# Patient Record
Sex: Male | Born: 1990 | Race: White | Marital: Married | Smoking: Former smoker
Health system: Southern US, Community
[De-identification: ages and names within clinical notes are randomized; demographics above are authoritative.]

## PROBLEM LIST (undated history)

## (undated) DIAGNOSIS — K589 Irritable bowel syndrome without diarrhea: Secondary | ICD-10-CM

## (undated) DIAGNOSIS — G629 Polyneuropathy, unspecified: Secondary | ICD-10-CM

## (undated) DIAGNOSIS — C801 Malignant (primary) neoplasm, unspecified: Secondary | ICD-10-CM

---

## 2016-04-09 ENCOUNTER — Emergency Department
Admission: EM | Admit: 2016-04-09 | Discharge: 2016-04-09 | Disposition: A | Discharge status: Home / Self Care | Payer: Self-pay | Attending: Emergency Medicine | Admitting: Emergency Medicine

## 2016-04-09 ENCOUNTER — Encounter: Payer: Self-pay | Admitting: Emergency Medicine

## 2016-04-09 ENCOUNTER — Emergency Department: Payer: Self-pay

## 2016-04-09 DIAGNOSIS — N132 Hydronephrosis with renal and ureteral calculous obstruction: Secondary | ICD-10-CM

## 2016-04-09 DIAGNOSIS — Z87891 Personal history of nicotine dependence: Secondary | ICD-10-CM | POA: Insufficient documentation

## 2016-04-09 DIAGNOSIS — R590 Localized enlarged lymph nodes: Secondary | ICD-10-CM

## 2016-04-09 DIAGNOSIS — K589 Irritable bowel syndrome without diarrhea: Secondary | ICD-10-CM

## 2016-04-09 DIAGNOSIS — R079 Chest pain, unspecified: Secondary | ICD-10-CM | POA: Insufficient documentation

## 2016-04-09 DIAGNOSIS — R5381 Other malaise: Secondary | ICD-10-CM

## 2016-04-09 DIAGNOSIS — Z85830 Personal history of malignant neoplasm of bone: Secondary | ICD-10-CM | POA: Insufficient documentation

## 2016-04-09 DIAGNOSIS — J029 Acute pharyngitis, unspecified: Secondary | ICD-10-CM

## 2016-04-09 DIAGNOSIS — R5383 Other fatigue: Secondary | ICD-10-CM | POA: Insufficient documentation

## 2016-04-09 HISTORY — DX: Irritable bowel syndrome, unspecified: K58.9

## 2016-04-09 HISTORY — DX: Malignant (primary) neoplasm, unspecified: C80.1

## 2016-04-09 HISTORY — DX: Polyneuropathy, unspecified: G62.9

## 2016-04-09 LAB — COMPREHENSIVE METABOLIC PANEL
ALBUMIN: 4.9 g/dL (ref 3.5–5.0)
ALK PHOS: 48 U/L (ref 38–126)
ALT: 17 U/L (ref 17–63)
ANION GAP: 7 (ref 5–15)
AST: 18 U/L (ref 15–41)
BUN: 9 mg/dL (ref 6–20)
CALCIUM: 9.7 mg/dL (ref 8.9–10.3)
CHLORIDE: 100 mmol/L — AB (ref 101–111)
CO2: 32 mmol/L (ref 22–32)
Creatinine, Ser: 0.92 mg/dL (ref 0.61–1.24)
GFR calc non Af Amer: >60 mL/min (ref >60)
GLUCOSE: 91 mg/dL (ref 65–99)
Potassium: 4 mmol/L (ref 3.5–5.1)
SODIUM: 139 mmol/L (ref 135–145)
Total Bilirubin: 0.7 mg/dL (ref 0.3–1.2)
Total Protein: 8 g/dL (ref 6.5–8.1)

## 2016-04-09 LAB — CBC WITH DIFFERENTIAL/PLATELET
BASOS ABS: 0.2 10*3/uL — AB (ref 0–0.1)
Basophils Relative: 3 %
Eosinophils Absolute: 0.4 10*3/uL (ref 0–0.7)
Eosinophils Relative: 6 %
HEMATOCRIT: 39.7 % — AB (ref 40.0–52.0)
HEMOGLOBIN: 13.7 g/dL (ref 13.0–18.0)
LYMPHS ABS: 1.7 10*3/uL (ref 1.0–3.6)
LYMPHS PCT: 27 %
MCH: 30.3 pg (ref 26.0–34.0)
MCHC: 34.5 g/dL (ref 32.0–36.0)
MCV: 87.7 fL (ref 80.0–100.0)
Monocytes Absolute: 0.5 10*3/uL (ref 0.2–1.0)
Monocytes Relative: 8 %
NEUTROS ABS: 3.6 10*3/uL (ref 1.4–6.5)
Neutrophils Relative %: 56 %
Platelets: 204 10*3/uL (ref 150–440)
RBC: 4.53 MIL/uL (ref 4.40–5.90)
RDW: 13.7 % (ref 11.5–14.5)
WBC: 6.3 10*3/uL (ref 3.8–10.6)

## 2016-04-09 LAB — POCT RAPID STREP A: STREPTOCOCCUS, GROUP A SCREEN (DIRECT): NEGATIVE

## 2016-04-09 LAB — URINALYSIS COMPLETE WITH MICROSCOPIC (ARMC ONLY)
BACTERIA UA: NONE SEEN
Bilirubin Urine: NEGATIVE
Glucose, UA: NEGATIVE mg/dL
Hgb urine dipstick: NEGATIVE
KETONES UR: NEGATIVE mg/dL
Nitrite: NEGATIVE
PH: 7 (ref 5.0–8.0)
PROTEIN: NEGATIVE mg/dL
SQUAMOUS EPITHELIAL / LPF: NONE SEEN
Specific Gravity, Urine: 1.006 (ref 1.005–1.030)

## 2016-04-09 LAB — MONONUCLEOSIS SCREEN: MONO SCREEN: NEGATIVE

## 2016-04-09 LAB — TSH: TSH: 3.373 u[IU]/mL (ref 0.350–4.500)

## 2016-04-09 LAB — TROPONIN I

## 2016-04-09 LAB — LIPASE, BLOOD: Lipase: 28 U/L (ref 11–51)

## 2016-04-09 MED ORDER — DIATRIZOATE MEGLUMINE & SODIUM 66-10 % PO SOLN
15.0000 mL | ORAL | Status: AC
  Administered 2016-04-09: 30 mL via ORAL

## 2016-04-09 MED ORDER — SODIUM CHLORIDE 0.9 % IV BOLUS (SEPSIS)
1000.0000 mL | Freq: Once | INTRAVENOUS | Status: AC
  Administered 2016-04-09: 1000 mL via INTRAVENOUS

## 2016-04-09 MED ORDER — IOPAMIDOL (ISOVUE-300) INJECTION 61%
100.0000 mL | Freq: Once | INTRAVENOUS | Status: AC | PRN
  Administered 2016-04-09: 100 mL via INTRAVENOUS
  Filled 2016-04-09: qty 100

## 2016-04-09 MED ORDER — OXYCODONE-ACETAMINOPHEN 5-325 MG PO TABS: 1.0000 | ORAL_TABLET | Freq: Four times a day (QID) | ORAL | Status: AC | PRN

## 2016-04-09 MED ORDER — TAMSULOSIN HCL 0.4 MG PO CAPS: 0.4000 mg | ORAL_CAPSULE | Freq: Every day | ORAL | Status: AC

## 2016-04-09 NOTE — Discharge Instructions (Signed)
Kidney Stones °Kidney stones (urolithiasis) are deposits that form inside your kidneys. The intense pain is caused by the stone moving through the urinary tract. When the stone moves, the ureter goes into spasm around the stone. The stone is usually passed in the urine.  °CAUSES  °· A disorder that makes certain neck glands produce too much parathyroid hormone (primary hyperparathyroidism). °· A buildup of uric acid crystals, similar to gout in your joints. °· Narrowing (stricture) of the ureter. °· A kidney obstruction present at birth (congenital obstruction). °· Previous surgery on the kidney or ureters. °· Numerous kidney infections. °SYMPTOMS  °· Feeling sick to your stomach (nauseous). °· Throwing up (vomiting). °· Blood in the urine (hematuria). °· Pain that usually spreads (radiates) to the groin. °· Frequency or urgency of urination. °DIAGNOSIS  °· Taking a history and physical exam. °· Blood or urine tests. °· CT scan. °· Occasionally, an examination of the inside of the urinary bladder (cystoscopy) is performed. °TREATMENT  °· Observation. °· Increasing your fluid intake. °· Extracorporeal shock wave lithotripsy--This is a noninvasive procedure that uses shock waves to break up kidney stones. °· Surgery may be needed if you have severe pain or persistent obstruction. There are various surgical procedures. Most of the procedures are performed with the use of small instruments. Only small incisions are needed to accommodate these instruments, so recovery time is minimized. °The size, location, and chemical composition are all important variables that will determine the proper choice of action for you. Talk to your health care provider to better understand your situation so that you will minimize the risk of injury to yourself and your kidney.  °HOME CARE INSTRUCTIONS  °· Drink enough water and fluids to keep your urine clear or pale yellow. This will help you to pass the stone or stone fragments. °· Strain  all urine through the provided strainer. Keep all particulate matter and stones for your health care provider to see. The stone causing the pain may be as small as a grain of salt. It is very important to use the strainer each and every time you pass your urine. The collection of your stone will allow your health care provider to analyze it and verify that a stone has actually passed. The stone analysis will often identify what you can do to reduce the incidence of recurrences. °· Only take over-the-counter or prescription medicines for pain, discomfort, or fever as directed by your health care provider. °· Keep all follow-up visits as told by your health care provider. This is important. °· Get follow-up X-rays if required. The absence of pain does not always mean that the stone has passed. It may have only stopped moving. If the urine remains completely obstructed, it can cause loss of kidney function or even complete destruction of the kidney. It is your responsibility to make sure X-rays and follow-ups are completed. Ultrasounds of the kidney can show blockages and the status of the kidney. Ultrasounds are not associated with any radiation and can be performed easily in a matter of minutes. °· Make changes to your daily diet as told by your health care provider. You may be told to: °¨ Limit the amount of salt that you eat. °¨ Eat 5 or more servings of fruits and vegetables each day. °¨ Limit the amount of meat, poultry, fish, and eggs that you eat. °· Collect a 24-hour urine sample as told by your health care provider. You may need to collect another urine sample every 6-12   months. °SEEK MEDICAL CARE IF: °· You experience pain that is progressive and unresponsive to any pain medicine you have been prescribed. °SEEK IMMEDIATE MEDICAL CARE IF:  °· Pain cannot be controlled with the prescribed medicine. °· You have a fever or shaking chills. °· The severity or intensity of pain increases over 18 hours and is not  relieved by pain medicine. °· You develop a new onset of abdominal pain. °· You feel faint or pass out. °· You are unable to urinate. °  °This information is not intended to replace advice given to you by your health care provider. Make sure you discuss any questions you have with your health care provider. °  °Document Released: 09/28/2005 Document Revised: 06/19/2015 Document Reviewed: 03/01/2013 °Elsevier Interactive Patient Education ©2016 [employer] ° °

## 2016-04-09 NOTE — ED Notes (Signed)
C/O "lump just above adams apple and to jaw line"  X 2-3 months.  Symptoms have been evaluated at [facility] and "they don't have an answer for what is going on in my throat.

## 2016-04-09 NOTE — ED Provider Notes (Signed)
[facility] Emergency Department Provider Note  ____________________________________________  Time seen: Approximately 6:08 PM  I have reviewed the triage vital signs and the nursing notes.   HISTORY  Chief Complaint lumps to throat and neck     HPI Arthur Larson is a 25 y.o. male who presents to the emergency department complaining of multiple medical complaints. Patient states that he has been seeing [facility] for multiple complaints without diagnosis. Patient states that symptoms began 2 months prior with a sore throat. He was tested for strep which she states was negative. States that the pain has continued since this time. Patient also endorses "lumps" to the right side of his neck and above his Adam's apple. Patient states that he has experienced chest pain, abdominal pain, a 50 pound weight loss, hematochezia for the last 2 months. Patient states that he has a history of bone cancer that was treated with radiation in 2013. In 2014 he was diagnosed with IBS but states that over the past 2 months his GI bleeding has improved from baseline. Patient still complains of worsening left upper quadrant abdominal pain. Patient states that over the past several years he has had colonoscopies and endoscopies. These have been unremarkable with the exception of the occasional polyp. Patient reports just feeling "horrible all over." He denies any fevers, headache, neck pain, visual changes, diarrhea this time, dysuria, polyuria, hematuria. He does endorse sore throat, chest pain, abdominal pain at this time.Patient also reports malaise, fatigue.   Past Medical History  Diagnosis Date  . IBS (irritable bowel syndrome)   . Neuropathy ([place])   . 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   History reviewed. No pertinent past surgical history.  Current Outpatient Rx  Name  Route  Sig  Dispense  Refill  .  oxyCODONE-acetaminophen (ROXICET) 5-325 MG tablet   Oral   Take 1 tablet by mouth every 6 (six) hours as needed for severe pain.   20 tablet   0   . tamsulosin (FLOMAX) 0.4 MG CAPS capsule   Oral   Take 1 capsule (0.4 mg total) by mouth daily.   30 capsule   0     Allergies Review of patient's allergies indicates no known allergies.  No family history on file.  Social History Social History  Substance Use Topics  . Smoking status: Former Research scientist (life sciences)  . Smokeless tobacco: Never Used  . Alcohol Use: No     Review of Systems  Constitutional: No fever/chills Eyes: No visual changes. No discharge ENT: Positive for sore throat. Denies nasal congestion or ear pain. Positive for "lump" to the right side of the neck. Positive for lump above the "Adam's apple." Cardiovascular: Positive chest pain. Respiratory: no cough. No SOB. Gastrointestinal: Positive left upper quadrant abdominal pain. Positive for GI bleeding.  No nausea, no vomiting.  No diarrhea.  No constipation. Genitourinary: Negative for dysuria. No hematuria Musculoskeletal: Negative for musculoskeletal pain. Skin: Negative for rash, abrasions, lacerations, ecchymosis.  Neurological: Negative for headaches, focal weakness or numbness. 10-point ROS otherwise negative.  ____________________________________________   PHYSICAL EXAM:  VITAL SIGNS: ED Triage Vitals  Enc Vitals Group     BP 04/09/16 1559 127/76 mmHg     Pulse Rate 04/09/16 1559 81     Resp 04/09/16 1559 16     Temp 04/09/16 1559 98 F (36.7 C)     Temp Source  04/09/16 1559 Oral     SpO2 04/09/16 1559 100 %     Weight 04/09/16 1559 140 lb (63.504 kg)     Height 04/09/16 1559 5\' 11"  (1.803 m)     Head Cir --      Peak Flow --      Pain Score 04/09/16 1601 4     Pain Loc --      Pain Edu? --      Excl. in [place]? --      Constitutional: Alert and oriented. Well appearing and in no acute distress. Eyes: Conjunctivae are normal. PERRL. EOMI. Head:  Atraumatic. ENT:      Ears: EACs are unremarkable bilaterally. TMs are unremarkable bilaterally.      Nose: No congestion/rhinnorhea.      Mouth/Throat: Mucous membranes are moist. Oropharynx is mildly erythematous but nonedematous. Uvula is midline. Tonsils are unremarkable. Neck: No stridor. Neck is supple full range of motion Hematological/Lymphatic/Immunilogical: Diffuse anterior and posterior cervical lymphadenopathy on right side. Anterior cervical lymphadenopathy on the the left but no posterior chain involvement. Cardiovascular: Normal rate, regular rhythm. Normal S1 and S2.  Good peripheral circulation. Respiratory: Normal respiratory effort without tachypnea or retractions. Lungs CTAB. Good air entry to the bases with no decreased or absent breath sounds. Gastrointestinal: Bowel sounds 4 quadrants. Soft to palpation. Patient is tender to palpation left upper quadrant.. No guarding or rigidity. No palpable masses. No palpable hepato-or splenomegaly. No distention. No CVA tenderness. Musculoskeletal: Full range of motion to all extremities. No gross deformities appreciated. Neurologic:  Normal speech and language. No gross focal neurologic deficits are appreciated.  Skin:  Skin is warm, dry and intact. No rash noted. Psychiatric: Mood and affect are normal. Speech and behavior are normal. Patient exhibits appropriate insight and judgement.   ____________________________________________   LABS (all labs ordered are listed, but only abnormal results are displayed)  Labs Reviewed  COMPREHENSIVE METABOLIC PANEL - Abnormal; Notable for the following:    Chloride 100 (*)    All other components within normal limits  CBC WITH DIFFERENTIAL/PLATELET - Abnormal; Notable for the following:    HCT 39.7 (*)    Basophils Absolute 0.2 (*)    All other components within normal limits  URINALYSIS COMPLETEWITH MICROSCOPIC ([facility] ONLY) - Abnormal; Notable for the following:    Color, Urine STRAW  (*)    APPearance CLEAR (*)    Leukocytes, UA TRACE (*)    All other components within normal limits  LIPASE, BLOOD  TROPONIN I  MONONUCLEOSIS SCREEN  TSH  T4  POCT RAPID STREP A   ____________________________________________  EKG  EKG reveals normal sinus rhythm at a rate of 63 bpm. No ST elevations or depressions noted. PR, QRS, QT intervals within normal limits. No Q waves or delta waves identified. ____________________________________________  RADIOLOGY Diamantina [facility] Bastian Andreoli, personally viewed and evaluated these images as part of my medical decision making, as well as reviewing the written report by the radiologist.  Ct Soft Tissue Neck W Contrast  04/09/2016  CLINICAL DATA:  Evaluate lumps in the neck and throat. These are ongoing for several weeks. EXAM: CT NECK WITH CONTRAST TECHNIQUE: Multidetector CT imaging of the neck was performed using the standard protocol following the bolus administration of intravenous contrast. CONTRAST:  188mL ISOVUE-300 IOPAMIDOL (ISOVUE-300) INJECTION 61% COMPARISON:  CT chest abdomen and pelvis reported separately. FINDINGS: Pharynx and larynx: Normal Salivary glands: Normal Thyroid: Normal Lymph nodes: Nonspecific BILATERAL level I and II lymphadenopathy, slightly larger  on the LEFT, 8-9 mm in short axis. These are nonspecific, most commonly reactive in nature. Vascular: Patent Limited intracranial: Negative. Visualized orbits: Negative. Mastoids and visualized paranasal sinuses: No fluid or masses. Skeleton: No osseous findings. Upper chest: Reported separately. IMPRESSION: Nonspecific cervical adenopathy, most commonly reactive in nature. Within limits for detection on neck CT, no mucosal lesion, or tonsillar/adenoidal inflammatory change. Electronically Signed   By: Staci Righter M.D.   On: 04/09/2016 21:06   Ct Chest W Contrast  04/09/2016  CLINICAL DATA:  History of inflammatory bowel disease and unintentional significant weight loss. Per  patient's report history of bone marrow cancer. Neck masses. EXAM: CT CHEST WITH CONTRAST CT ABDOMEN AND PELVIS WITH CONTRAST TECHNIQUE: Multidetector CT imaging of the chest was performed during intravenous contrast administration. Multidetector CT imaging of the abdomen and pelvis was performed following the standard protocol during bolus administration of intravenous contrast. CONTRAST:  151mL ISOVUE-300 IOPAMIDOL (ISOVUE-300) INJECTION 61% COMPARISON:  None. FINDINGS: CT CHEST FINDINGS Mediastinum/Lymph Nodes: No masses, pathologically enlarged lymph nodes, or other significant abnormality. Lungs/Pleura: No pulmonary mass, infiltrate, or effusion. Musculoskeletal: No chest wall mass or suspicious bone lesions identified. CT ABDOMEN PELVIS FINDINGS Hepatobiliary: No masses or other significant abnormality. The gallbladder is collapse, otherwise normal. Pancreas: No mass, inflammatory changes, or other significant abnormality. Spleen: Within normal limits in size and appearance. Adrenals/Urinary Tract: The right kidney has normal appearance. There is a by a 9 by 5 mm obstructing calculus within the proximal left ureter causing upstream moderate hydroureter and hydronephrosis. Stomach/Bowel: No evidence of obstruction. There is a smooth wall thickening of long segment of the ascending colon and cecum. Vascular/Lymphatic: No pathologically enlarged lymph nodes. No evidence of abdominal aortic aneurysm. Reproductive: No mass or other significant abnormality. Other: None. Musculoskeletal:  No suspicious bone lesions identified. IMPRESSION: 9 mm proximal left ureteral calculus causing obstructive uropathy. Long segment of smooth wall thickening of the cecum and ascending colon, with likely inflammatory appearance. No other acute abnormalities within the chest abdomen or pelvis. Electronically Signed   By: Fidela Salisbury M.D.   On: 04/09/2016 20:56   Ct Abdomen Pelvis W Contrast  04/09/2016  CLINICAL DATA:   History of inflammatory bowel disease and unintentional significant weight loss. Per patient's report history of bone marrow cancer. Neck masses. EXAM: CT CHEST WITH CONTRAST CT ABDOMEN AND PELVIS WITH CONTRAST TECHNIQUE: Multidetector CT imaging of the chest was performed during intravenous contrast administration. Multidetector CT imaging of the abdomen and pelvis was performed following the standard protocol during bolus administration of intravenous contrast. CONTRAST:  134mL ISOVUE-300 IOPAMIDOL (ISOVUE-300) INJECTION 61% COMPARISON:  None. FINDINGS: CT CHEST FINDINGS Mediastinum/Lymph Nodes: No masses, pathologically enlarged lymph nodes, or other significant abnormality. Lungs/Pleura: No pulmonary mass, infiltrate, or effusion. Musculoskeletal: No chest wall mass or suspicious bone lesions identified. CT ABDOMEN PELVIS FINDINGS Hepatobiliary: No masses or other significant abnormality. The gallbladder is collapse, otherwise normal. Pancreas: No mass, inflammatory changes, or other significant abnormality. Spleen: Within normal limits in size and appearance. Adrenals/Urinary Tract: The right kidney has normal appearance. There is a by a 9 by 5 mm obstructing calculus within the proximal left ureter causing upstream moderate hydroureter and hydronephrosis. Stomach/Bowel: No evidence of obstruction. There is a smooth wall thickening of long segment of the ascending colon and cecum. Vascular/Lymphatic: No pathologically enlarged lymph nodes. No evidence of abdominal aortic aneurysm. Reproductive: No mass or other significant abnormality. Other: None. Musculoskeletal:  No suspicious bone lesions identified. IMPRESSION: 9 mm proximal left  ureteral calculus causing obstructive uropathy. Long segment of smooth wall thickening of the cecum and ascending colon, with likely inflammatory appearance. No other acute abnormalities within the chest abdomen or pelvis. Electronically Signed   By: Fidela Salisbury M.D.   On:  04/09/2016 20:56    ____________________________________________    PROCEDURES  Procedure(s) performed:       Medications  diatrizoate meglumine-sodium (GASTROGRAFIN) 66-10 % solution 15 mL (30 mLs Oral Given 04/09/16 1833)  sodium chloride 0.9 % bolus 1,000 mL (0 mLs Intravenous Stopped 04/09/16 2309)  iopamidol (ISOVUE-300) 61 % injection 100 mL (100 mLs Intravenous Contrast Given 04/09/16 2021)     ____________________________________________   INITIAL IMPRESSION / ASSESSMENT AND PLAN / ED COURSE  Pertinent labs & imaging results that were available during my care of the patient were reviewed by me and considered in my medical decision making (see chart for details).  Patient's diagnosis is consistent with Hydronephrosis of obstructing calculus in the left ureter. This is an incidental finding on CT scan. Patient is not symptomatic for this complaint. Patient presented to the emergency department complaining of fatigue, lymphadenopathy, sore throat, weight loss. Labs, imaging, and physical exam are reassuring at this time for no acute processes. Patient does have the incidental finding of hydronephrosis from nonobstructing calculus. Patient is to be referred to urology for further management of this condition. Patient will be discharged home with Flomax and Percocet as needed. Patient still has complaint of sore throat, lymphadenopathy, bruits and fatigue, and IBS. However as mentioned above no significant findings are appreciated on exam, labs, and imaging to explain symptoms.. Patient will follow-up with the [facility] for further evaluation of these complaints. Patient is given ED precautions to return to the ED for any worsening or new symptoms.     ____________________________________________  FINAL CLINICAL IMPRESSION(S) / ED DIAGNOSES  Final diagnoses:  Hydronephrosis with obstructing calculus  Sore throat  Lymphadenopathy, cervical  Malaise and fatigue  IBS  (irritable bowel syndrome)      NEW MEDICATIONS STARTED DURING THIS VISIT:  Discharge Medication List as of 04/09/2016 10:09 PM    START taking these medications   Details  oxyCODONE-acetaminophen (ROXICET) 5-325 MG tablet Take 1 tablet by mouth every 6 (six) hours as needed for severe pain., Starting 04/09/2016, Until Discontinued, Print    tamsulosin (FLOMAX) 0.4 MG CAPS capsule Take 1 capsule (0.4 mg total) by mouth daily., Starting 04/09/2016, Until Discontinued, Print            This chart was dictated using voice recognition software/Dragon. Despite best efforts to proofread, errors can occur which can change the meaning. Any change was purely unintentional.    Darletta Moll, PA-C 04/09/16 2353  Harvest Dark, MD 04/10/16 2255

## 2016-04-09 NOTE — ED Notes (Signed)
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

## 2016-04-11 LAB — T4: T4 TOTAL: 7.4 ug/dL (ref 4.5–12.0)

## 2017-03-12 IMAGING — CT CT CHEST W/ CM
2 of 4 series · 13 of 36 positions shown, 16 images · IV contrast (iopamidol)
Comparison: None.

CLINICAL DATA: History of inflammatory bowel disease and
unintentional significant weight loss. Per patient's report history
of bone marrow cancer. Neck masses.

EXAM:
CT CHEST WITH CONTRAST
CT ABDOMEN AND PELVIS WITH CONTRAST
TECHNIQUE: Multidetector CT imaging of the chest was performed during
intravenous contrast administration. Multidetector CT imaging of the
abdomen and pelvis was performed following the standard protocol
during bolus administration of intravenous contrast.
CONTRAST:  100mL 6NMCV0-JII IOPAMIDOL (6NMCV0-JII) INJECTION 61%

[Series 3: cap with · axial · 0.69mm/px · z∈[-810,-240]mm · 10 of 138 slices shown, 13 images]
[im 12/138  mediastinal]
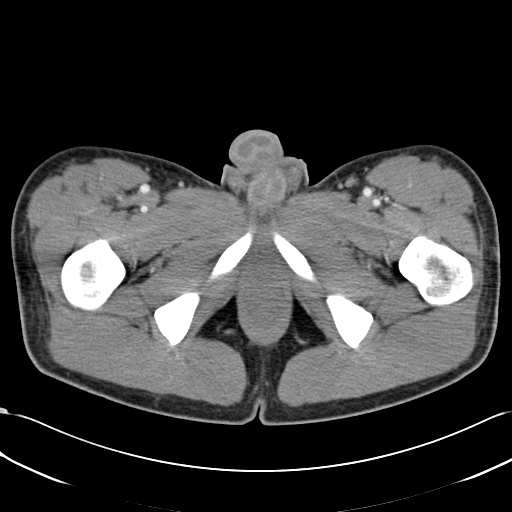
[im 12/138  lung]
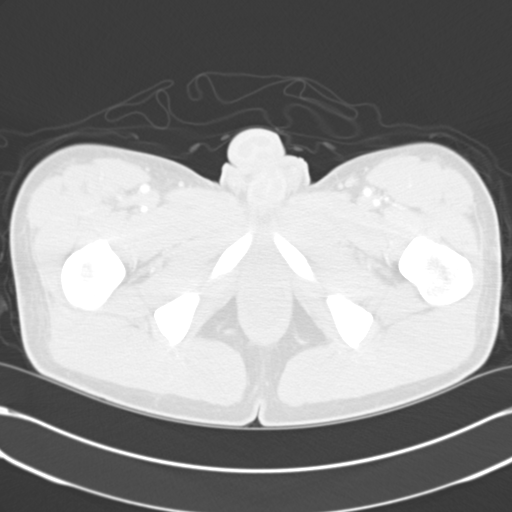
[im 23/138  lung]
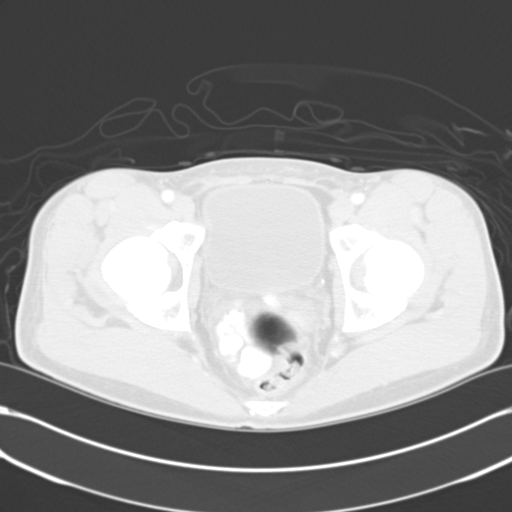
[im 35/138  lung]
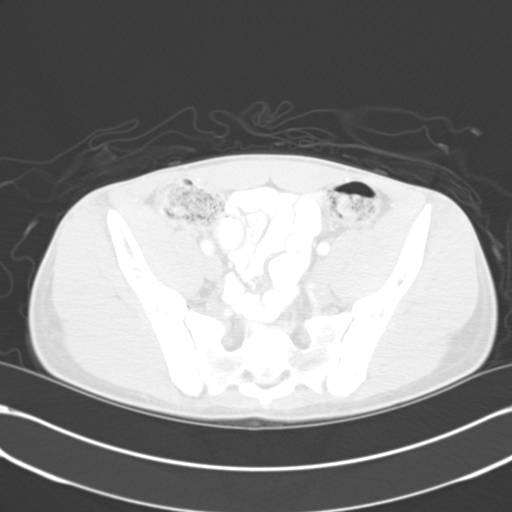
[im 46/138  lung]
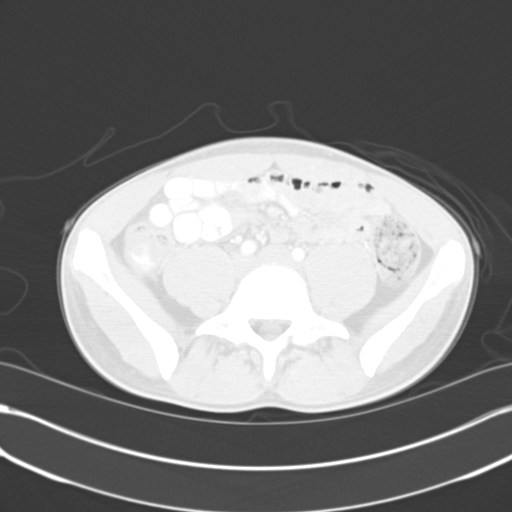
[im 58/138  mediastinal]
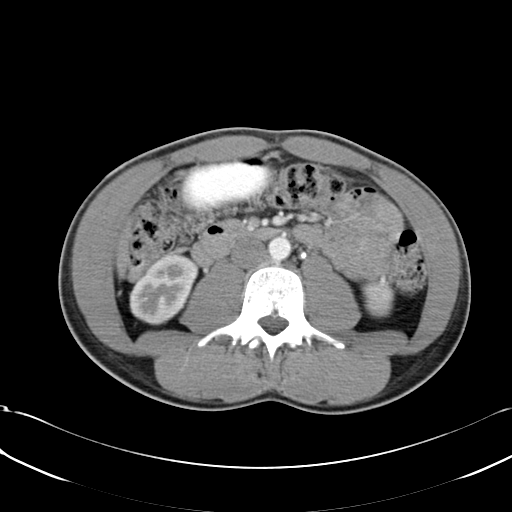
[im 58/138  lung]
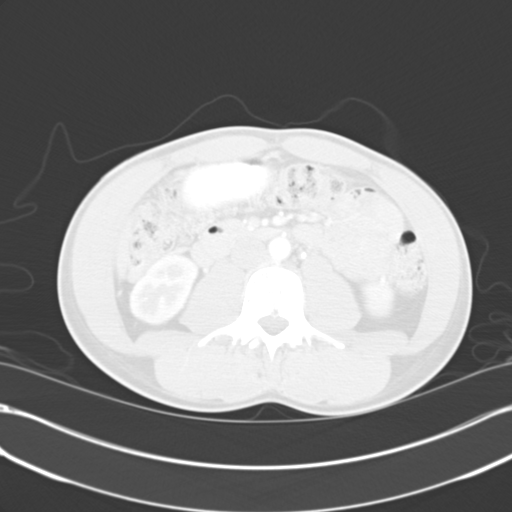
[im 80/138  lung]
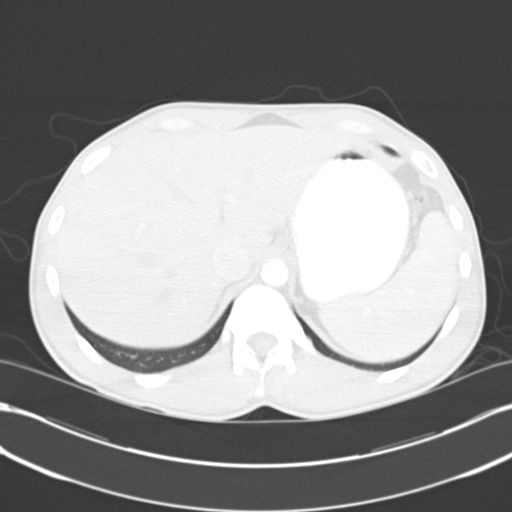
[im 92/138  lung]
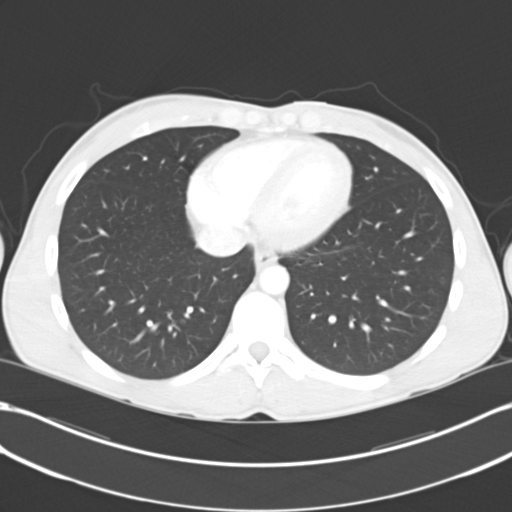
[im 103/138  lung]
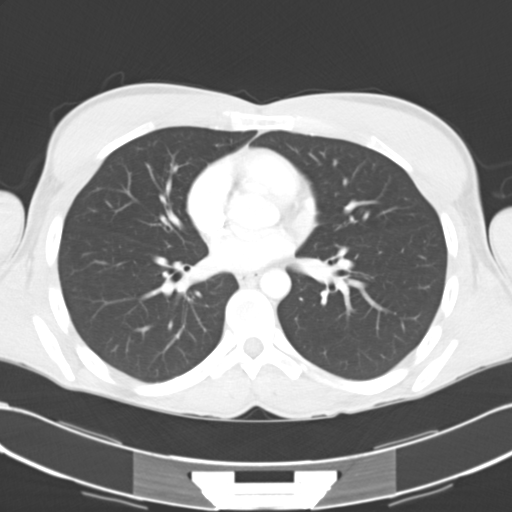
[im 115/138  mediastinal]
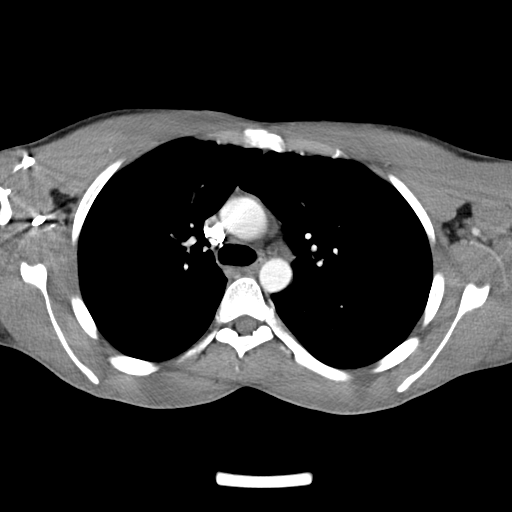
[im 115/138  lung]
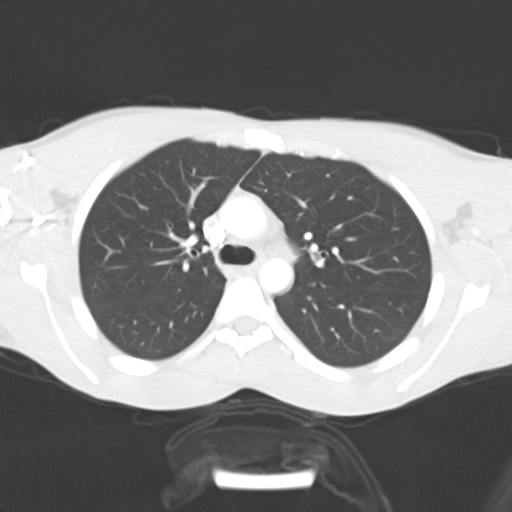
[im 126/138  lung]
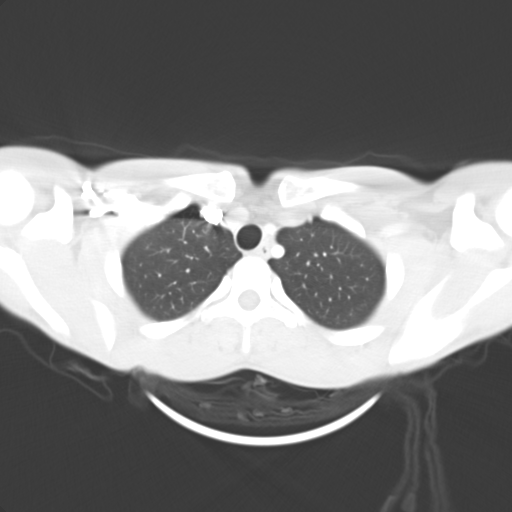

[Series 7: cor cap with · coronal · 0.66mm/px · 3 of 112 slices shown]
[im 23/112  lung]
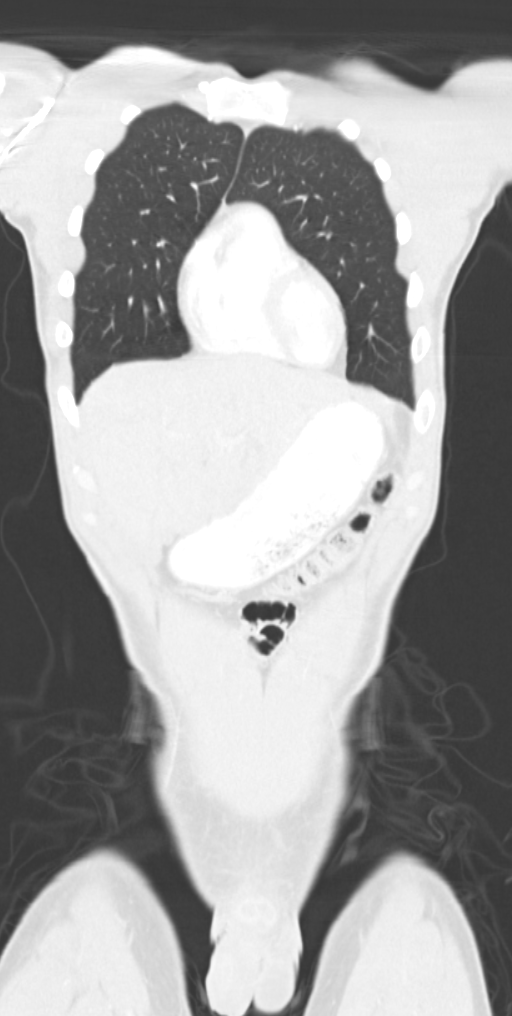
[im 45/112  lung]
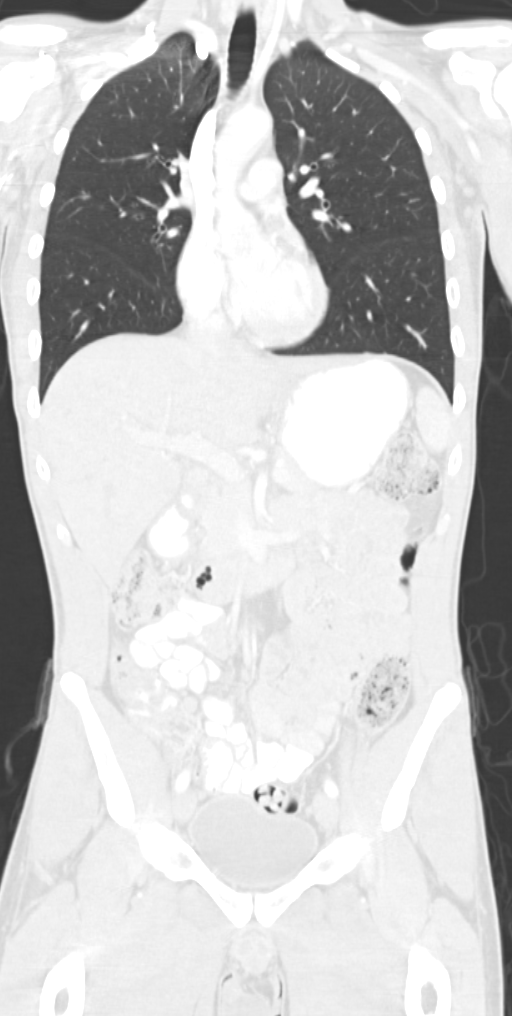
[im 67/112  lung]
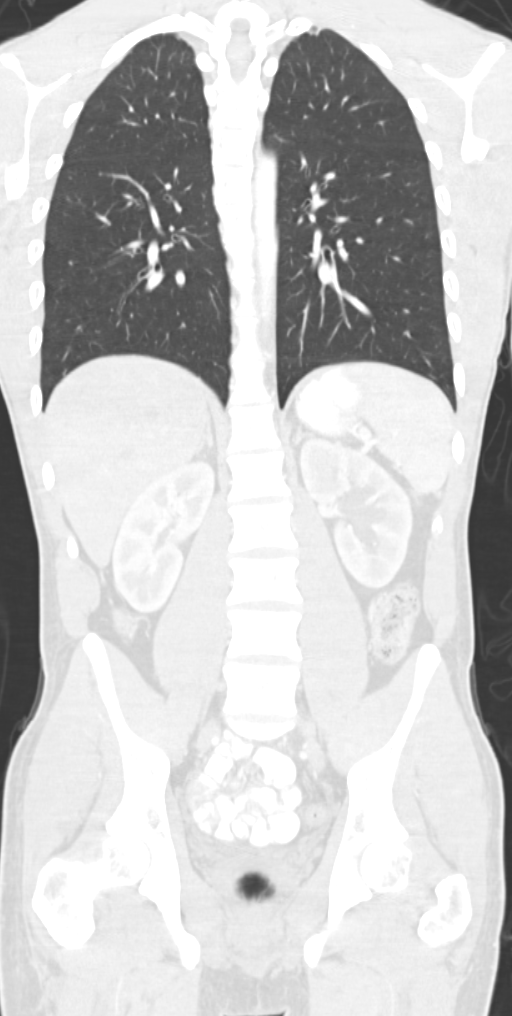

[13 of 36 positions shown; findings below may reference images not displayed]

FINDINGS: CT CHEST FINDINGS

Mediastinum/Lymph Nodes: No masses, pathologically enlarged lymph
nodes, or other significant abnormality.

Lungs/Pleura: No pulmonary mass, infiltrate, or effusion.

Musculoskeletal: No chest wall mass or suspicious bone lesions
identified.

CT ABDOMEN PELVIS FINDINGS

Hepatobiliary: No masses or other significant abnormality. The
gallbladder is collapse, otherwise normal.

Pancreas: No mass, inflammatory changes, or other significant
abnormality.

Spleen: Within normal limits in size and appearance.

Adrenals/Urinary Tract: The right kidney has normal appearance.
There is a by a 9 by 5 mm obstructing calculus within the proximal
left ureter causing upstream moderate hydroureter and
hydronephrosis.

Stomach/Bowel: No evidence of obstruction. There is a smooth wall
thickening of long segment of the ascending colon and cecum.

Vascular/Lymphatic: No pathologically enlarged lymph nodes. No
evidence of abdominal aortic aneurysm.

Reproductive: No mass or other significant abnormality.

Other: None.

Musculoskeletal:  No suspicious bone lesions identified.
IMPRESSION: 9 mm proximal left ureteral calculus causing obstructive uropathy.

Long segment of smooth wall thickening of the cecum and ascending
colon, with likely inflammatory appearance.

No other acute abnormalities within the chest abdomen or pelvis.
